# Patient Record
Sex: Female | Born: 1960 | Race: White | Hispanic: No | Marital: Married | Smoking: Never smoker
Health system: Southern US, Community
[De-identification: ages and names within clinical notes are randomized; demographics above are authoritative.]

## PROBLEM LIST (undated history)

## (undated) DIAGNOSIS — M461 Sacroiliitis, not elsewhere classified: Secondary | ICD-10-CM

---

## 1998-01-27 ENCOUNTER — Inpatient Hospital Stay (HOSPITAL_COMMUNITY): Admission: AD | Admit: 1998-01-27 | Discharge: 1998-01-29 | Payer: Self-pay | Admitting: Obstetrics and Gynecology

## 1999-01-03 ENCOUNTER — Other Ambulatory Visit: Admission: RE | Admit: 1999-01-03 | Discharge: 1999-01-03 | Payer: Self-pay | Admitting: Obstetrics and Gynecology

## 2000-01-23 ENCOUNTER — Other Ambulatory Visit: Admission: RE | Admit: 2000-01-23 | Discharge: 2000-01-23 | Payer: Self-pay | Admitting: Obstetrics and Gynecology

## 2001-01-19 ENCOUNTER — Other Ambulatory Visit: Admission: RE | Admit: 2001-01-19 | Discharge: 2001-01-19 | Payer: Self-pay | Admitting: Obstetrics and Gynecology

## 2002-03-09 ENCOUNTER — Other Ambulatory Visit: Admission: RE | Admit: 2002-03-09 | Discharge: 2002-03-09 | Payer: Self-pay | Admitting: Obstetrics and Gynecology

## 2003-08-22 ENCOUNTER — Other Ambulatory Visit: Admission: RE | Admit: 2003-08-22 | Discharge: 2003-08-22 | Payer: Self-pay | Admitting: Obstetrics and Gynecology

## 2004-11-06 ENCOUNTER — Other Ambulatory Visit: Admission: RE | Admit: 2004-11-06 | Discharge: 2004-11-06 | Payer: Self-pay | Admitting: Obstetrics and Gynecology

## 2005-09-19 ENCOUNTER — Encounter: Admission: RE | Admit: 2005-09-19 | Discharge: 2005-09-19 | Payer: Self-pay

## 2006-06-14 ENCOUNTER — Emergency Department (HOSPITAL_COMMUNITY): Admission: EM | Admit: 2006-06-14 | Discharge: 2006-06-14 | Payer: Self-pay | Admitting: Emergency Medicine

## 2006-09-18 ENCOUNTER — Ambulatory Visit (HOSPITAL_COMMUNITY): Admission: RE | Admit: 2006-09-18 | Discharge: 2006-09-18 | Payer: Self-pay | Admitting: Orthopaedic Surgery

## 2006-09-24 ENCOUNTER — Other Ambulatory Visit: Admission: RE | Admit: 2006-09-24 | Discharge: 2006-09-24 | Payer: Self-pay | Admitting: Obstetrics and Gynecology

## 2006-10-31 ENCOUNTER — Encounter: Admission: RE | Admit: 2006-10-31 | Discharge: 2006-10-31 | Payer: Self-pay

## 2009-04-13 ENCOUNTER — Emergency Department (HOSPITAL_COMMUNITY): Admission: EM | Admit: 2009-04-13 | Discharge: 2009-04-13 | Payer: Self-pay | Admitting: Emergency Medicine

## 2014-08-11 ENCOUNTER — Inpatient Hospital Stay (HOSPITAL_COMMUNITY)
Admission: EM | Admit: 2014-08-11 | Discharge: 2014-08-16 | DRG: 419 | Disposition: A | Discharge status: Home / Self Care | Payer: 59 | Attending: General Surgery | Admitting: General Surgery

## 2014-08-11 ENCOUNTER — Encounter (HOSPITAL_COMMUNITY): Payer: Self-pay | Admitting: Emergency Medicine

## 2014-08-11 ENCOUNTER — Emergency Department (HOSPITAL_COMMUNITY): Payer: 59

## 2014-08-11 DIAGNOSIS — K8000 Calculus of gallbladder with acute cholecystitis without obstruction: Principal | ICD-10-CM | POA: Diagnosis present

## 2014-08-11 DIAGNOSIS — Z791 Long term (current) use of non-steroidal anti-inflammatories (NSAID): Secondary | ICD-10-CM

## 2014-08-11 DIAGNOSIS — K8010 Calculus of gallbladder with chronic cholecystitis without obstruction: Secondary | ICD-10-CM | POA: Diagnosis present

## 2014-08-11 DIAGNOSIS — R1011 Right upper quadrant pain: Secondary | ICD-10-CM | POA: Diagnosis not present

## 2014-08-11 DIAGNOSIS — Z886 Allergy status to analgesic agent status: Secondary | ICD-10-CM

## 2014-08-11 DIAGNOSIS — Z882 Allergy status to sulfonamides status: Secondary | ICD-10-CM

## 2014-08-11 HISTORY — DX: Sacroiliitis, not elsewhere classified: M46.1

## 2014-08-11 LAB — COMPREHENSIVE METABOLIC PANEL
ALT: 33 U/L (ref 0–35)
ANION GAP: 14 (ref 5–15)
AST: 20 U/L (ref 0–37)
Albumin: 3.6 g/dL (ref 3.5–5.2)
Alkaline Phosphatase: 237 U/L — ABNORMAL HIGH (ref 39–117)
BUN: 7 mg/dL (ref 6–23)
CALCIUM: 10.3 mg/dL (ref 8.4–10.5)
CO2: 29 mEq/L (ref 19–32)
Chloride: 100 mEq/L (ref 96–112)
Creatinine, Ser: 0.82 mg/dL (ref 0.50–1.10)
GFR calc non Af Amer: 80 mL/min — ABNORMAL LOW (ref >90)
GLUCOSE: 119 mg/dL — AB (ref 70–99)
Potassium: 4.6 mEq/L (ref 3.7–5.3)
SODIUM: 143 meq/L (ref 137–147)
TOTAL PROTEIN: 8 g/dL (ref 6.0–8.3)
Total Bilirubin: 0.8 mg/dL (ref 0.3–1.2)

## 2014-08-11 LAB — CBC WITH DIFFERENTIAL/PLATELET
Basophils Absolute: 0 10*3/uL (ref 0.0–0.1)
Basophils Relative: 0 % (ref 0–1)
EOS ABS: 0.1 10*3/uL (ref 0.0–0.7)
EOS PCT: 1 % (ref 0–5)
HCT: 41.2 % (ref 36.0–46.0)
Hemoglobin: 13.3 g/dL (ref 12.0–15.0)
LYMPHS ABS: 2.6 10*3/uL (ref 0.7–4.0)
Lymphocytes Relative: 20 % (ref 12–46)
MCH: 27.5 pg (ref 26.0–34.0)
MCHC: 32.3 g/dL (ref 30.0–36.0)
MCV: 85.3 fL (ref 78.0–100.0)
Monocytes Absolute: 0.8 10*3/uL (ref 0.1–1.0)
Monocytes Relative: 6 % (ref 3–12)
Neutro Abs: 9.5 10*3/uL — ABNORMAL HIGH (ref 1.7–7.7)
Neutrophils Relative %: 73 % (ref 43–77)
PLATELETS: 300 10*3/uL (ref 150–400)
RBC: 4.83 MIL/uL (ref 3.87–5.11)
RDW: 12.4 % (ref 11.5–15.5)
WBC: 13 10*3/uL — ABNORMAL HIGH (ref 4.0–10.5)

## 2014-08-11 LAB — URINALYSIS, ROUTINE W REFLEX MICROSCOPIC
Glucose, UA: NEGATIVE mg/dL
Hgb urine dipstick: NEGATIVE
Ketones, ur: 15 mg/dL — AB
Nitrite: NEGATIVE
PROTEIN: NEGATIVE mg/dL
Specific Gravity, Urine: 1.018 (ref 1.005–1.030)
UROBILINOGEN UA: 0.2 mg/dL (ref 0.0–1.0)
pH: 6 (ref 5.0–8.0)

## 2014-08-11 LAB — LIPASE, BLOOD: LIPASE: 12 U/L (ref 11–59)

## 2014-08-11 LAB — URINE MICROSCOPIC-ADD ON

## 2014-08-11 MED ORDER — MORPHINE SULFATE 4 MG/ML IJ SOLN
4.0000 mg | Freq: Once | INTRAMUSCULAR | Status: AC
  Administered 2014-08-11: 4 mg via INTRAVENOUS
  Filled 2014-08-11: qty 1

## 2014-08-11 MED ORDER — METOCLOPRAMIDE HCL 5 MG/ML IJ SOLN
10.0000 mg | INTRAMUSCULAR | Status: AC
  Administered 2014-08-11: 10 mg via INTRAVENOUS
  Filled 2014-08-11: qty 2

## 2014-08-11 MED ORDER — HYDROMORPHONE HCL PF 1 MG/ML IJ SOLN
1.0000 mg | Freq: Once | INTRAMUSCULAR | Status: AC
  Administered 2014-08-11: 1 mg via INTRAVENOUS
  Filled 2014-08-11: qty 1

## 2014-08-11 MED ORDER — SODIUM CHLORIDE 0.9 % IV BOLUS (SEPSIS)
1000.0000 mL | Freq: Once | INTRAVENOUS | Status: AC
  Administered 2014-08-11: 1000 mL via INTRAVENOUS

## 2014-08-11 MED ORDER — ONDANSETRON 4 MG PO TBDP
8.0000 mg | ORAL_TABLET | Freq: Once | ORAL | Status: AC
  Administered 2014-08-11: 8 mg via ORAL
  Filled 2014-08-11: qty 2

## 2014-08-11 NOTE — ED Provider Notes (Signed)
CSN: [id number]     Arrival date & time 08/11/14  1708 History   First MD Initiated Contact with Patient 08/11/14 2011     Chief Complaint  Patient presents with  . Cholelithiasis    (Consider location/radiation/quality/duration/timing/severity/associated sxs/prior Treatment) HPI Comments: Patient is a 53 year old female with no significant past medical history who presents to the emergency department for right upper abdominal pain. Patient states that she has had the pain for 4-6 weeks. She states the pain is sharp in nature and radiates into her chest at times. Patient denies any alleviating factors of her symptoms, but she states that eating makes her pain worse. She states that every time she eats she vomits and when she is not experiencing emesis she is nauseous. Patient also states she has been having brown, watery diarrhea fairly consistently since symptom onset. Patient denies fever, chest pain, shortness of breath, melena or hematochezia, hematemesis, dysuria or hematuria, vaginal complaints, and syncope. Patient denies a history of abdominal surgeries. She states that she has had a 25 pound weight loss unintentionally in the last 3 months; she thinks this is likely secondary to decreased oral intake in an effort to lessen her vomiting.  The history is provided by the patient. No language interpreter was used.    Past Medical History  Diagnosis Date  . SI (sacroiliac) joint inflammation    History reviewed. No pertinent past surgical history. No family history on file. History  Substance Use Topics  . Smoking status: Never Smoker   . Smokeless tobacco: Not on file  . Alcohol Use: No   OB History   Grav Para Term Preterm Abortions TAB SAB Ect Mult Living                  Review of Systems  Constitutional: Negative for fever.  Respiratory: Negative for shortness of breath.   Cardiovascular: Negative for chest pain.  Gastrointestinal: Positive for nausea, vomiting, abdominal  pain and diarrhea.  Genitourinary: Negative for dysuria, hematuria, vaginal bleeding and vaginal discharge.  All other systems reviewed and are negative.    Allergies  Sulfa antibiotics; Adhesive; and Toradol  Home Medications   Prior to Admission medications   Medication Sig Start Date End Date Taking? Authorizing Provider  clonazePAM (KLONOPIN) 0.5 MG tablet Take 0.5 mg by mouth at bedtime.   Yes Historical Provider, MD  HYDROcodone-acetaminophen (NORCO) 7.5-325 MG per tablet Take 1 tablet by mouth every 8 (eight) hours.   Yes Historical Provider, MD   BP 118/59  Pulse 73  Temp(Src) 98.2 F (36.8 C) (Oral)  Resp 17  Ht 5\' 7"  (1.702 m)  Wt 162 lb (73.483 kg)  BMI 25.37 kg/m2  SpO2 93%  Physical Exam  Nursing note and vitals reviewed. Constitutional: She is oriented to person, place, and time. She appears well-developed and well-nourished. No distress.  Nontoxic/nonseptic appearing  HENT:  Head: Normocephalic and atraumatic.  Mouth/Throat: Oropharynx is clear and moist. No oropharyngeal exudate.  Eyes: Conjunctivae and EOM are normal. No scleral icterus.  Neck: Normal range of motion.  Cardiovascular: Normal rate, regular rhythm and intact distal pulses.   Murmur (III/VI diastolic murmur) heard. Pulmonary/Chest: Effort normal and breath sounds normal. No respiratory distress. She has no wheezes. She has no rales.  Chest expansion symmetric. No tachypnea or dyspnea.  Abdominal: Soft. She exhibits no distension. There is tenderness. There is guarding (Voluntary). There is no rebound.  Patient has a soft abdomen with tenderness to palpation in the right upper  abdomen and right mid abdomen. Positive Murphy's sign. No masses or peritoneal signs appreciated. No involuntary guarding.  Musculoskeletal: Normal range of motion.  Neurological: She is alert and oriented to person, place, and time. She exhibits normal muscle tone. Coordination normal.  Skin: Skin is warm and dry. No  rash noted. She is not diaphoretic. No erythema. No pallor.  Psychiatric: She has a normal mood and affect. Her behavior is normal.    ED Course  Procedures (including critical care time) Labs Review Labs Reviewed  CBC WITH DIFFERENTIAL - Abnormal; Notable for the following:    WBC 13.0 (*)    Neutro Abs 9.5 (*)    All other components within normal limits  COMPREHENSIVE METABOLIC PANEL - Abnormal; Notable for the following:    Glucose, Bld 119 (*)    Alkaline Phosphatase 237 (*)    GFR calc non Af Amer 80 (*)    All other components within normal limits  URINALYSIS, ROUTINE W REFLEX MICROSCOPIC - Abnormal; Notable for the following:    Color, Urine AMBER (*)    Bilirubin Urine SMALL (*)    Ketones, ur 15 (*)    Leukocytes, UA TRACE (*)    All other components within normal limits  LIPASE, BLOOD  URINE MICROSCOPIC-ADD ON    Imaging Review US Abdomen Complete  08/12/2014   CLINICAL DATA:  Abdominal pain.  EXAM: ULTRASOUND ABDOMEN COMPLETE  COMPARISON:  None.  FINDINGS: Gallbladder:  Multiple echogenic gallstones with acoustic shadowing measuring up to 15 mm. Gallbladder wall thickening to 4.1 mm. Sonographic Murphy's sign elicited. No pericholecystic fluid.  Common bile duct:  Diameter: 6 mm  Liver:  Diffusely mildly hypoechoic with apparent starry sky appearance. No intrahepatic biliary dilatation.  IVC:  No abnormality visualized.  Pancreas:  Visualized portion unremarkable.  Spleen:  Size and appearance within normal limits.  Right Kidney:  Length: 11 cm.  Mildly echogenic without mass or hydronephrosis.  Left Kidney:  Length: 10.5 cm.  Mildly echogenic without mass or hydronephrosis.  Abdominal aorta:  No aneurysm visualized.  Other findings:  None.  IMPRESSION: Cholelithiasis and suspected acute cholecystitis.  Starry sky appearance of liver could reflect acute hepatitis, right heart failure, consider dedicated chest radiograph an correlation with liver function tests.    Electronically Signed   By: Awilda Metro   On: 08/12/2014 00:10     EKG Interpretation None      MDM   Final diagnoses:  Right upper quadrant abdominal pain  Calculus of gallbladder with acute cholecystitis without obstruction    53 year old female presents to the emergency department for right upper abdominal pain x4-6 weeks. Symptoms associated with nausea associated with eating or drinking as well as looser brown stool. Patient with tenderness to palpation in her right upper abdomen with positive Murphy sign. No peritoneal signs. She was found to have a leukocytosis of 13 today as well as an elevated alkaline phosphatase of 237. Other LFTs and total bilirubin normal. Lipase normal.  Ultrasound today shows cholelithiasis and suspected acute cholecystitis. Ultrasound also notes a starry sky appearance of the liver; however, patient with no definitive signs of heart failure. No crackles, tachypnea, dyspnea, or hypoxia. I consulted with general surgery, Dr. Janee Morn, who will see the patient and evaluate for admission and, likely, laparoscopic cholecystectomy.   Filed Vitals:   08/11/14 2230 08/11/14 2300 08/12/14 0002 08/12/14 0030  BP: 139/71 117/57 115/51 118/59  Pulse: 77 76 76 73  Temp:      TempSrc:  Resp: 28 22 19 17   Height:      Weight:      SpO2: 94% 90% 93% 93%       Antony MaduraKelly Elliott Quade, PA-C 08/12/14 [phone]

## 2014-08-11 NOTE — ED Notes (Signed)
Pt reports 4-6 weeks of vomiting with eating and RUQ pain. Went to PCP recently, was told likely gallstones but was unable to make it to ultrasound appointment. Pt is nauseated at current. Pt is a x 4.

## 2014-08-11 NOTE — ED Notes (Signed)
Asked pt to provide urine specimen pt stated she could not provide one at this time.  

## 2014-08-12 ENCOUNTER — Observation Stay (HOSPITAL_COMMUNITY): Payer: 59 | Admitting: Anesthesiology

## 2014-08-12 ENCOUNTER — Emergency Department (HOSPITAL_COMMUNITY): Payer: 59

## 2014-08-12 ENCOUNTER — Encounter (HOSPITAL_COMMUNITY): Payer: 59 | Admitting: Anesthesiology

## 2014-08-12 ENCOUNTER — Encounter (HOSPITAL_COMMUNITY): Admission: EM | Disposition: A | Discharge status: Home / Self Care | Payer: Self-pay | Source: Home / Self Care

## 2014-08-12 DIAGNOSIS — R1011 Right upper quadrant pain: Secondary | ICD-10-CM

## 2014-08-12 DIAGNOSIS — K8020 Calculus of gallbladder without cholecystitis without obstruction: Secondary | ICD-10-CM

## 2014-08-12 DIAGNOSIS — K8010 Calculus of gallbladder with chronic cholecystitis without obstruction: Secondary | ICD-10-CM | POA: Diagnosis present

## 2014-08-12 HISTORY — PX: CHOLECYSTECTOMY: SHX55

## 2014-08-12 LAB — CBC
HCT: 33.4 % — ABNORMAL LOW (ref 36.0–46.0)
Hemoglobin: 11.1 g/dL — ABNORMAL LOW (ref 12.0–15.0)
MCH: 28.8 pg (ref 26.0–34.0)
MCHC: 33.2 g/dL (ref 30.0–36.0)
MCV: 86.5 fL (ref 78.0–100.0)
PLATELETS: 231 10*3/uL (ref 150–400)
RBC: 3.86 MIL/uL — ABNORMAL LOW (ref 3.87–5.11)
RDW: 12.4 % (ref 11.5–15.5)
WBC: 8.9 10*3/uL (ref 4.0–10.5)

## 2014-08-12 LAB — COMPREHENSIVE METABOLIC PANEL
ALK PHOS: 229 U/L — AB (ref 39–117)
ALT: 33 U/L (ref 0–35)
ANION GAP: 11 (ref 5–15)
AST: 33 U/L (ref 0–37)
Albumin: 2.5 g/dL — ABNORMAL LOW (ref 3.5–5.2)
BILIRUBIN TOTAL: 0.7 mg/dL (ref 0.3–1.2)
BUN: 6 mg/dL (ref 6–23)
CO2: 26 mEq/L (ref 19–32)
Calcium: 8.8 mg/dL (ref 8.4–10.5)
Chloride: 106 mEq/L (ref 96–112)
Creatinine, Ser: 0.88 mg/dL (ref 0.50–1.10)
GFR, EST AFRICAN AMERICAN: 85 mL/min — AB (ref >90)
GFR, EST NON AFRICAN AMERICAN: 73 mL/min — AB (ref >90)
GLUCOSE: 139 mg/dL — AB (ref 70–99)
Potassium: 4 mEq/L (ref 3.7–5.3)
Sodium: 143 mEq/L (ref 137–147)
Total Protein: 6.2 g/dL (ref 6.0–8.3)

## 2014-08-12 LAB — SURGICAL PCR SCREEN
MRSA, PCR: NEGATIVE
STAPHYLOCOCCUS AUREUS: NEGATIVE

## 2014-08-12 SURGERY — LAPAROSCOPIC CHOLECYSTECTOMY WITH INTRAOPERATIVE CHOLANGIOGRAM
Anesthesia: General | Site: Abdomen

## 2014-08-12 MED ORDER — DIPHENHYDRAMINE HCL 50 MG/ML IJ SOLN
INTRAMUSCULAR | Status: AC
  Filled 2014-08-12: qty 1

## 2014-08-12 MED ORDER — NEOSTIGMINE METHYLSULFATE 10 MG/10ML IV SOLN
INTRAVENOUS | Status: DC | PRN
  Administered 2014-08-12: 3 mg via INTRAVENOUS

## 2014-08-12 MED ORDER — OXYCODONE HCL 5 MG/5ML PO SOLN: 5.0000 mg | Freq: Once | ORAL | Status: DC | PRN

## 2014-08-12 MED ORDER — ACETAMINOPHEN 325 MG PO TABS: 650.0000 mg | ORAL_TABLET | Freq: Four times a day (QID) | ORAL | Status: DC | PRN

## 2014-08-12 MED ORDER — CIPROFLOXACIN IN D5W 400 MG/200ML IV SOLN
400.0000 mg | Freq: Two times a day (BID) | INTRAVENOUS | Status: DC
  Administered 2014-08-12 – 2014-08-13 (×4): 400 mg via INTRAVENOUS
  Filled 2014-08-12 (×6): qty 200

## 2014-08-12 MED ORDER — DEXAMETHASONE SODIUM PHOSPHATE 4 MG/ML IJ SOLN
INTRAMUSCULAR | Status: AC
  Filled 2014-08-12: qty 1

## 2014-08-12 MED ORDER — PANTOPRAZOLE SODIUM 40 MG IV SOLR
40.0000 mg | Freq: Every day | INTRAVENOUS | Status: DC
  Administered 2014-08-12 – 2014-08-13 (×2): 40 mg via INTRAVENOUS
  Filled 2014-08-12 (×5): qty 40

## 2014-08-12 MED ORDER — DIPHENHYDRAMINE HCL 12.5 MG/5ML PO ELIX: 12.5000 mg | ORAL_SOLUTION | Freq: Four times a day (QID) | ORAL | Status: DC | PRN

## 2014-08-12 MED ORDER — ROCURONIUM BROMIDE 100 MG/10ML IV SOLN
INTRAVENOUS | Status: DC | PRN
  Administered 2014-08-12: 35 mg via INTRAVENOUS
  Administered 2014-08-12: 5 mg via INTRAVENOUS
  Administered 2014-08-12: 10 mg via INTRAVENOUS

## 2014-08-12 MED ORDER — OXYCODONE HCL 5 MG PO TABS: 5.0000 mg | ORAL_TABLET | Freq: Once | ORAL | Status: DC | PRN

## 2014-08-12 MED ORDER — HYDROMORPHONE HCL PF 1 MG/ML IJ SOLN: 0.2500 mg | INTRAMUSCULAR | Status: DC | PRN

## 2014-08-12 MED ORDER — 0.9 % SODIUM CHLORIDE (POUR BTL) OPTIME
TOPICAL | Status: DC | PRN
  Administered 2014-08-12: 1000 mL

## 2014-08-12 MED ORDER — GLYCOPYRROLATE 0.2 MG/ML IJ SOLN
INTRAMUSCULAR | Status: DC | PRN
  Administered 2014-08-12: 0.4 mg via INTRAVENOUS

## 2014-08-12 MED ORDER — PHENYLEPHRINE HCL 10 MG/ML IJ SOLN
INTRAMUSCULAR | Status: DC | PRN
  Administered 2014-08-12: 120 ug via INTRAVENOUS

## 2014-08-12 MED ORDER — BSS IO SOLN
INTRAOCULAR | Status: AC
  Filled 2014-08-12: qty 15

## 2014-08-12 MED ORDER — DIPHENHYDRAMINE HCL 50 MG/ML IJ SOLN
12.5000 mg | Freq: Four times a day (QID) | INTRAMUSCULAR | Status: DC | PRN
  Administered 2014-08-12: 12.5 mg via INTRAVENOUS

## 2014-08-12 MED ORDER — PROMETHAZINE HCL 25 MG/ML IJ SOLN: 6.2500 mg | INTRAMUSCULAR | Status: DC | PRN

## 2014-08-12 MED ORDER — ONDANSETRON HCL 4 MG/2ML IJ SOLN
INTRAMUSCULAR | Status: DC | PRN
  Administered 2014-08-12: 4 mg via INTRAVENOUS

## 2014-08-12 MED ORDER — BUPIVACAINE-EPINEPHRINE 0.25% -1:200000 IJ SOLN
INTRAMUSCULAR | Status: DC | PRN
  Administered 2014-08-12: 30 mL

## 2014-08-12 MED ORDER — FENTANYL CITRATE 0.05 MG/ML IJ SOLN
INTRAMUSCULAR | Status: AC
  Filled 2014-08-12: qty 5

## 2014-08-12 MED ORDER — MORPHINE SULFATE 2 MG/ML IJ SOLN
2.0000 mg | INTRAMUSCULAR | Status: DC | PRN
  Administered 2014-08-12 – 2014-08-15 (×23): 4 mg via INTRAVENOUS
  Filled 2014-08-12: qty 2
  Filled 2014-08-12: qty 1
  Filled 2014-08-12 (×4): qty 2
  Filled 2014-08-12: qty 1
  Filled 2014-08-12 (×17): qty 2

## 2014-08-12 MED ORDER — ACETAMINOPHEN 650 MG RE SUPP: 650.0000 mg | Freq: Four times a day (QID) | RECTAL | Status: DC | PRN

## 2014-08-12 MED ORDER — PIPERACILLIN-TAZOBACTAM 3.375 G IVPB
3.3750 g | Freq: Once | INTRAVENOUS | Status: AC
  Administered 2014-08-12: 3.375 g via INTRAVENOUS
  Filled 2014-08-12: qty 50

## 2014-08-12 MED ORDER — LIDOCAINE HCL (CARDIAC) 20 MG/ML IV SOLN
INTRAVENOUS | Status: DC | PRN
  Administered 2014-08-12: 100 mg via INTRAVENOUS

## 2014-08-12 MED ORDER — LIDOCAINE HCL (CARDIAC) 20 MG/ML IV SOLN
INTRAVENOUS | Status: AC
  Filled 2014-08-12: qty 5

## 2014-08-12 MED ORDER — ROCURONIUM BROMIDE 50 MG/5ML IV SOLN
INTRAVENOUS | Status: AC
  Filled 2014-08-12: qty 1

## 2014-08-12 MED ORDER — MIDAZOLAM HCL 5 MG/5ML IJ SOLN
INTRAMUSCULAR | Status: DC | PRN
  Administered 2014-08-12: 2 mg via INTRAVENOUS

## 2014-08-12 MED ORDER — PROPOFOL 10 MG/ML IV BOLUS
INTRAVENOUS | Status: DC | PRN
  Administered 2014-08-12: 150 mg via INTRAVENOUS

## 2014-08-12 MED ORDER — ONDANSETRON HCL 4 MG/2ML IJ SOLN
INTRAMUSCULAR | Status: AC
  Filled 2014-08-12: qty 2

## 2014-08-12 MED ORDER — PROPOFOL 10 MG/ML IV BOLUS
INTRAVENOUS | Status: AC
  Filled 2014-08-12: qty 20

## 2014-08-12 MED ORDER — ONDANSETRON HCL 4 MG/2ML IJ SOLN
4.0000 mg | Freq: Four times a day (QID) | INTRAMUSCULAR | Status: DC | PRN
  Administered 2014-08-13 – 2014-08-15 (×5): 4 mg via INTRAVENOUS
  Filled 2014-08-12 (×5): qty 2

## 2014-08-12 MED ORDER — DEXTROSE-NACL 5-0.45 % IV SOLN
INTRAVENOUS | Status: DC
  Administered 2014-08-12 – 2014-08-14 (×5): via INTRAVENOUS

## 2014-08-12 MED ORDER — SODIUM CHLORIDE 0.9 % IR SOLN
Status: DC | PRN
  Administered 2014-08-12: 1000 mL

## 2014-08-12 MED ORDER — ONDANSETRON HCL 4 MG/2ML IJ SOLN
4.0000 mg | INTRAMUSCULAR | Status: AC
  Administered 2014-08-12: 4 mg via INTRAVENOUS
  Filled 2014-08-12: qty 2

## 2014-08-12 MED ORDER — LACTATED RINGERS IV SOLN
INTRAVENOUS | Status: DC | PRN
  Administered 2014-08-12 (×2): via INTRAVENOUS

## 2014-08-12 MED ORDER — IOHEXOL 300 MG/ML  SOLN: 25.0000 mL | Freq: Once | INTRAMUSCULAR | Status: DC | PRN

## 2014-08-12 MED ORDER — ENOXAPARIN SODIUM 40 MG/0.4ML ~~LOC~~ SOLN
40.0000 mg | SUBCUTANEOUS | Status: DC
  Administered 2014-08-13 – 2014-08-16 (×4): 40 mg via SUBCUTANEOUS
  Filled 2014-08-12 (×5): qty 0.4

## 2014-08-12 MED ORDER — HEMOSTATIC AGENTS (NO CHARGE) OPTIME
TOPICAL | Status: DC | PRN
  Administered 2014-08-12: 1 via TOPICAL

## 2014-08-12 MED ORDER — CLONAZEPAM 0.5 MG PO TABS
0.5000 mg | ORAL_TABLET | Freq: Every day | ORAL | Status: DC
  Administered 2014-08-12 – 2014-08-15 (×5): 0.5 mg via ORAL
  Filled 2014-08-12 (×5): qty 1

## 2014-08-12 MED ORDER — DEXAMETHASONE SODIUM PHOSPHATE 10 MG/ML IJ SOLN
INTRAMUSCULAR | Status: DC | PRN
  Administered 2014-08-12: 4 mg via INTRAVENOUS

## 2014-08-12 MED ORDER — MIDAZOLAM HCL 2 MG/2ML IJ SOLN
INTRAMUSCULAR | Status: AC
  Filled 2014-08-12: qty 2

## 2014-08-12 MED ORDER — FENTANYL CITRATE 0.05 MG/ML IJ SOLN
INTRAMUSCULAR | Status: DC | PRN
  Administered 2014-08-12: 50 ug via INTRAVENOUS
  Administered 2014-08-12: 100 ug via INTRAVENOUS
  Administered 2014-08-12: 50 ug via INTRAVENOUS
  Administered 2014-08-12: 100 ug via INTRAVENOUS
  Administered 2014-08-12: 50 ug via INTRAVENOUS

## 2014-08-12 MED ORDER — BUPIVACAINE-EPINEPHRINE (PF) 0.25% -1:200000 IJ SOLN
INTRAMUSCULAR | Status: AC
Start: 2014-08-12 — End: 2014-08-12
  Filled 2014-08-12: qty 30

## 2014-08-12 SURGICAL SUPPLY — 59 items
APL SKNCLS STERI-STRIP NONHPOA (GAUZE/BANDAGES/DRESSINGS) ×1
APPLIER CLIP ROT 10 11.4 M/L (STAPLE) ×3
APR CLP MED LRG 11.4X10 (STAPLE) ×1
BAG SPEC RTRVL LRG 6X4 10 (ENDOMECHANICALS) ×1
BENZOIN TINCTURE PRP APPL 2/3 (GAUZE/BANDAGES/DRESSINGS) ×3 IMPLANT
BLADE SURG ROTATE 9660 (MISCELLANEOUS) IMPLANT
CANISTER SUCTION 2500CC (MISCELLANEOUS) ×3 IMPLANT
CHLORAPREP W/TINT 26ML (MISCELLANEOUS) ×3 IMPLANT
CLIP APPLIE ROT 10 11.4 M/L (STAPLE) ×1 IMPLANT
CLOSURE WOUND 1/2 X4 (GAUZE/BANDAGES/DRESSINGS) ×1
COVER MAYO STAND STRL (DRAPES) ×3 IMPLANT
COVER SURGICAL LIGHT HANDLE (MISCELLANEOUS) ×3 IMPLANT
DRAIN CHANNEL 19F RND (DRAIN) ×2 IMPLANT
DRAPE C-ARM 42X72 X-RAY (DRAPES) ×3 IMPLANT
DRAPE LAPAROSCOPIC ABDOMINAL (DRAPES) ×2 IMPLANT
DRAPE UTILITY 15X26 W/TAPE STR (DRAPE) ×6 IMPLANT
DRSG TEGADERM 2-3/8X2-3/4 SM (GAUZE/BANDAGES/DRESSINGS) ×5 IMPLANT
DRSG TEGADERM 4X4.75 (GAUZE/BANDAGES/DRESSINGS) ×1 IMPLANT
ELECT REM PT RETURN 9FT ADLT (ELECTROSURGICAL) ×3
ELECTRODE REM PT RTRN 9FT ADLT (ELECTROSURGICAL) ×1 IMPLANT
EVACUATOR SILICONE 100CC (DRAIN) ×2 IMPLANT
FILTER SMOKE EVAC LAPAROSHD (FILTER) ×3 IMPLANT
GAUZE SPONGE 2X2 8PLY STRL LF (GAUZE/BANDAGES/DRESSINGS) ×1 IMPLANT
GLOVE BIO SURGEON STRL SZ7 (GLOVE) ×7 IMPLANT
GLOVE BIO SURGEON STRL SZ7.5 (GLOVE) ×4 IMPLANT
GLOVE BIOGEL PI IND STRL 7.0 (GLOVE) IMPLANT
GLOVE BIOGEL PI IND STRL 7.5 (GLOVE) ×1 IMPLANT
GLOVE BIOGEL PI IND STRL 8 (GLOVE) IMPLANT
GLOVE BIOGEL PI INDICATOR 7.0 (GLOVE) ×4
GLOVE BIOGEL PI INDICATOR 7.5 (GLOVE) ×4
GLOVE BIOGEL PI INDICATOR 8 (GLOVE) ×2
GOWN STRL REUS W/ TWL LRG LVL3 (GOWN DISPOSABLE) ×4 IMPLANT
GOWN STRL REUS W/ TWL XL LVL3 (GOWN DISPOSABLE) IMPLANT
GOWN STRL REUS W/TWL LRG LVL3 (GOWN DISPOSABLE) ×12
GOWN STRL REUS W/TWL XL LVL3 (GOWN DISPOSABLE) ×3
HEMOSTAT SNOW SURGICEL 2X4 (HEMOSTASIS) ×2 IMPLANT
KIT BASIN OR (CUSTOM PROCEDURE TRAY) ×3 IMPLANT
KIT ROOM TURNOVER OR (KITS) ×3 IMPLANT
NS IRRIG 1000ML POUR BTL (IV SOLUTION) ×3 IMPLANT
PAD ARMBOARD 7.5X6 YLW CONV (MISCELLANEOUS) ×3 IMPLANT
POUCH SPECIMEN RETRIEVAL 10MM (ENDOMECHANICALS) ×3 IMPLANT
SCISSORS LAP 5X35 DISP (ENDOMECHANICALS) ×3 IMPLANT
SET CHOLANGIOGRAPH 5 50 .035 (SET/KITS/TRAYS/PACK) ×1 IMPLANT
SET IRRIG TUBING LAPAROSCOPIC (IRRIGATION / IRRIGATOR) ×3 IMPLANT
SLEEVE ENDOPATH XCEL 5M (ENDOMECHANICALS) ×3 IMPLANT
SPECIMEN JAR SMALL (MISCELLANEOUS) ×3 IMPLANT
SPONGE GAUZE 2X2 STER 10/PKG (GAUZE/BANDAGES/DRESSINGS) ×2
SPONGE GAUZE 4X4 12PLY STER LF (GAUZE/BANDAGES/DRESSINGS) ×2 IMPLANT
STRIP CLOSURE SKIN 1/2X4 (GAUZE/BANDAGES/DRESSINGS) ×1 IMPLANT
SUT ETHILON 2 0 FS 18 (SUTURE) ×2 IMPLANT
SUT MNCRL AB 4-0 PS2 18 (SUTURE) ×3 IMPLANT
SUT VICRYL 0 UR6 27IN ABS (SUTURE) ×2 IMPLANT
TAPE CLOTH SOFT 2X10 (GAUZE/BANDAGES/DRESSINGS) ×2 IMPLANT
TOWEL OR 17X24 6PK STRL BLUE (TOWEL DISPOSABLE) ×3 IMPLANT
TOWEL OR 17X26 10 PK STRL BLUE (TOWEL DISPOSABLE) ×3 IMPLANT
TRAY LAPAROSCOPIC (CUSTOM PROCEDURE TRAY) ×3 IMPLANT
TROCAR XCEL BLUNT TIP 100MML (ENDOMECHANICALS) ×3 IMPLANT
TROCAR XCEL NON-BLD 11X100MML (ENDOMECHANICALS) ×3 IMPLANT
TROCAR XCEL NON-BLD 5MMX100MML (ENDOMECHANICALS) ×3 IMPLANT

## 2014-08-12 NOTE — Transfer of Care (Signed)
Immediate Anesthesia Transfer of Care Note  Patient: Meghan Smith  Procedure(s) Performed: Procedure(s): LAPAROSCOPIC CHOLECYSTECTOMY (N/A)  Patient Location: PACU  Anesthesia Type:General  Level of Consciousness: awake, oriented and patient cooperative  Airway & Oxygen Therapy: Patient Spontanous Breathing and Patient connected to nasal cannula oxygen  Post-op Assessment: Report given to PACU RN, Post -op Vital signs reviewed and stable and Patient moving all extremities  Post vital signs: Reviewed and stable  Complications: No apparent anesthesia complications

## 2014-08-12 NOTE — Anesthesia Procedure Notes (Signed)
Procedure Name: Intubation Date/Time: 08/12/2014 11:10 AM Performed by: Coralee RudFLORES, Kali Deadwyler Pre-anesthesia Checklist: Patient identified, Emergency Drugs available, Suction available and Patient being monitored Patient Re-evaluated:Patient Re-evaluated prior to inductionOxygen Delivery Method: Circle system utilized Preoxygenation: Pre-oxygenation with 100% oxygen Intubation Type: IV induction Ventilation: Mask ventilation without difficulty Laryngoscope Size: Miller and 3 Grade View: Grade I Tube type: Oral Tube size: 7.5 mm Number of attempts: 1 Airway Equipment and Method: Stylet and Bite block Placement Confirmation: ETT inserted through vocal cords under direct vision,  breath sounds checked- equal and bilateral and positive ETCO2 Secured at: 22 cm Tube secured with: Tape Dental Injury: Teeth and Oropharynx as per pre-operative assessment

## 2014-08-12 NOTE — Anesthesia Preprocedure Evaluation (Addendum)
Anesthesia Evaluation  Patient identified by MRN, date of birth, ID band Patient awake    Reviewed: Allergy & Precautions, H&P , NPO status , Patient's Chart, lab work & pertinent test results, reviewed documented beta blocker date and time   Airway Mallampati: II TM Distance: >3 FB Neck ROM: Full    Dental  (+) Teeth Intact, Dental Advisory Given   Pulmonary neg pulmonary ROS,    Pulmonary exam normal       Cardiovascular negative cardio ROS      Neuro/Psych negative neurological ROS  negative psych ROS   GI/Hepatic negative GI ROS, Neg liver ROS,   Endo/Other  negative endocrine ROS  Renal/GU negative Renal ROS  negative genitourinary   Musculoskeletal negative musculoskeletal ROS (+)   Abdominal   Peds negative pediatric ROS (+)  Hematology negative hematology ROS (+)   Anesthesia Other Findings   Reproductive/Obstetrics negative OB ROS                          Anesthesia Physical Anesthesia Plan  ASA: II  Anesthesia Plan: General   Post-op Pain Management:    Induction: Intravenous  Airway Management Planned: Oral ETT  Additional Equipment:   Intra-op Plan:   Post-operative Plan: Extubation in OR  Informed Consent: I have reviewed the patients History and Physical, chart, labs and discussed the procedure including the risks, benefits and alternatives for the proposed anesthesia with the patient or authorized representative who has indicated his/her understanding and acceptance.   Dental advisory given  Plan Discussed with: CRNA, Anesthesiologist and Surgeon  Anesthesia Plan Comments:        Anesthesia Quick Evaluation

## 2014-08-12 NOTE — H&P (Signed)
Meghan Smith is an 53 y.o. female.   Chief Complaint: RUQ abdominal pain, N/V HPI: Patient has been having episodic right upper quadrant abdominal pain associated with eating for the past 2 months. This became much worse 48 hours ago. She came to the emergency department for evaluation. She was found to have mild leukocytosis of 13,000. Ultrasound was done showing gallstones and suspected acute cholecystitis. Her pain is improved partially after receiving pain medication. She remains nauseated. I was asked to see her for admission.  Past Medical History  Diagnosis Date  . SI (sacroiliac) joint inflammation     History reviewed. No pertinent past surgical history.  No family history on file. Social History:  reports that she has never smoked. She does not have any smokeless tobacco history on file. She reports that she does not drink alcohol or use illicit drugs.  Allergies:  Allergies  Allergen Reactions  . Sulfa Antibiotics Shortness Of Breath, Itching, Swelling and Rash  . Adhesive [Tape] Other (See Comments)    Takes skin off with tape  . Toradol [Ketorolac Tromethamine] Swelling and Other (See Comments)    Face and lip swelling     (Not in a hospital admission)  Results for orders placed during the hospital encounter of 08/11/14 (from the past 48 hour(s))  CBC WITH DIFFERENTIAL     Status: Abnormal   Collection Time    08/11/14  5:28 PM      Result Value Ref Range   WBC 13.0 (*) 4.0 - 10.5 K/uL   RBC 4.83  3.87 - 5.11 MIL/uL   Hemoglobin 13.3  12.0 - 15.0 g/dL   HCT 41.2  36.0 - 46.0 %   MCV 85.3  78.0 - 100.0 fL   MCH 27.5  26.0 - 34.0 pg   MCHC 32.3  30.0 - 36.0 g/dL   RDW 12.4  11.5 - 15.5 %   Platelets 300  150 - 400 K/uL   Neutrophils Relative % 73  43 - 77 %   Neutro Abs 9.5 (*) 1.7 - 7.7 K/uL   Lymphocytes Relative 20  12 - 46 %   Lymphs Abs 2.6  0.7 - 4.0 K/uL   Monocytes Relative 6  3 - 12 %   Monocytes Absolute 0.8  0.1 - 1.0 K/uL   Eosinophils Relative 1   0 - 5 %   Eosinophils Absolute 0.1  0.0 - 0.7 K/uL   Basophils Relative 0  0 - 1 %   Basophils Absolute 0.0  0.0 - 0.1 K/uL  COMPREHENSIVE METABOLIC PANEL     Status: Abnormal   Collection Time    08/11/14  5:28 PM      Result Value Ref Range   Sodium 143  137 - 147 mEq/L   Potassium 4.6  3.7 - 5.3 mEq/L   Chloride 100  96 - 112 mEq/L   CO2 29  19 - 32 mEq/L   Glucose, Bld 119 (*) 70 - 99 mg/dL   BUN 7  6 - 23 mg/dL   Creatinine, Ser 0.82  0.50 - 1.10 mg/dL   Calcium 10.3  8.4 - 10.5 mg/dL   Total Protein 8.0  6.0 - 8.3 g/dL   Albumin 3.6  3.5 - 5.2 g/dL   AST 20  0 - 37 U/L   ALT 33  0 - 35 U/L   Alkaline Phosphatase 237 (*) 39 - 117 U/L   Total Bilirubin 0.8  0.3 - 1.2 mg/dL  GFR calc non Af Amer 80 (*) >90 mL/min   GFR calc Af Amer >90  >90 mL/min   Comment: (NOTE)     The eGFR has been calculated using the CKD EPI equation.     This calculation has not been validated in all clinical situations.     eGFR's persistently <90 mL/min signify possible Chronic Kidney     Disease.   Anion gap 14  5 - 15  LIPASE, BLOOD     Status: None   Collection Time    08/11/14  5:28 PM      Result Value Ref Range   Lipase 12  11 - 59 U/L  URINALYSIS, ROUTINE W REFLEX MICROSCOPIC     Status: Abnormal   Collection Time    08/11/14  8:25 PM      Result Value Ref Range   Color, Urine AMBER (*) YELLOW   Comment: BIOCHEMICALS MAY BE AFFECTED BY COLOR   APPearance CLEAR  CLEAR   Specific Gravity, Urine 1.018  1.005 - 1.030   pH 6.0  5.0 - 8.0   Glucose, UA NEGATIVE  NEGATIVE mg/dL   Hgb urine dipstick NEGATIVE  NEGATIVE   Bilirubin Urine SMALL (*) NEGATIVE   Ketones, ur 15 (*) NEGATIVE mg/dL   Protein, ur NEGATIVE  NEGATIVE mg/dL   Urobilinogen, UA 0.2  0.0 - 1.0 mg/dL   Nitrite NEGATIVE  NEGATIVE   Leukocytes, UA TRACE (*) NEGATIVE  URINE MICROSCOPIC-ADD ON     Status: None   Collection Time    08/11/14  8:25 PM      Result Value Ref Range   Squamous Epithelial / LPF RARE  RARE    WBC, UA 0-2  <3 WBC/hpf   Bacteria, UA RARE  RARE   Urine-Other MUCOUS PRESENT     Comment: AMORPHOUS URATES/PHOSPHATES   US Abdomen Complete  08/12/2014   CLINICAL DATA:  Abdominal pain.  EXAM: ULTRASOUND ABDOMEN COMPLETE  COMPARISON:  None.  FINDINGS: Gallbladder:  Multiple echogenic gallstones with acoustic shadowing measuring up to 15 mm. Gallbladder wall thickening to 4.1 mm. Sonographic Murphy's sign elicited. No pericholecystic fluid.  Common bile duct:  Diameter: 6 mm  Liver:  Diffusely mildly hypoechoic with apparent starry sky appearance. No intrahepatic biliary dilatation.  IVC:  No abnormality visualized.  Pancreas:  Visualized portion unremarkable.  Spleen:  Size and appearance within normal limits.  Right Kidney:  Length: 11 cm.  Mildly echogenic without mass or hydronephrosis.  Left Kidney:  Length: 10.5 cm.  Mildly echogenic without mass or hydronephrosis.  Abdominal aorta:  No aneurysm visualized.  Other findings:  None.  IMPRESSION: Cholelithiasis and suspected acute cholecystitis.  Starry sky appearance of liver could reflect acute hepatitis, right heart failure, consider dedicated chest radiograph an correlation with liver function tests.   Electronically Signed   By: Elon Alas   On: 08/12/2014 00:10    Review of Systems  Constitutional: Positive for weight loss and malaise/fatigue.  HENT: Negative.   Eyes: Negative.   Respiratory: Negative.   Cardiovascular: Negative.   Gastrointestinal: Positive for nausea, vomiting and abdominal pain.  Genitourinary: Negative.   Musculoskeletal: Negative.   Skin: Negative.   Neurological: Negative.   Endo/Heme/Allergies: Negative.   Psychiatric/Behavioral: Negative.     Blood pressure 118/59, pulse 73, temperature 98.2 F (36.8 C), temperature source Oral, resp. rate 17, height 5' 7"  (1.702 m), weight 162 lb (73.483 kg), SpO2 93.00%. Physical Exam  Constitutional: She is oriented to person, place,  and time. She appears  well-developed and well-nourished. No distress.  HENT:  Head: Normocephalic and atraumatic.  Mouth/Throat: Oropharynx is clear and moist. No oropharyngeal exudate.  Eyes: EOM are normal. Pupils are equal, round, and reactive to light. No scleral icterus.  Neck: Normal range of motion. Neck supple. No tracheal deviation present. No thyromegaly present.  Cardiovascular: Normal rate and normal heart sounds.   Rare ectopy  Respiratory: Effort normal and breath sounds normal. No stridor. No respiratory distress. She has no wheezes. She has no rales.  GI: Soft. She exhibits no distension. There is tenderness. There is no rebound and no guarding.    Tender right upper quadrant without guarding, no generalized tenderness, scar beneath the umbilicus  Musculoskeletal: Normal range of motion. She exhibits no edema and no tenderness.  Neurological: She is alert and oriented to person, place, and time. She exhibits normal muscle tone.  Skin: Skin is warm.  Psychiatric: She has a normal mood and affect.     Assessment/Plan Cholecystitis with cholelithiasis - will admit, keep n.p.o., give IV fluids and IV antibiotics. Plan will be for laparoscopic cholecystectomy with intraoperative cholangiogram this admission, possibly later today. I will discuss with my partner, Dr. Georgette Dover. Procedure, risks, and benefits were discussed in detail the patient and her husband. She is agreeable.  Donata Reddick E 08/12/2014, 1:07 AM

## 2014-08-12 NOTE — Anesthesia Postprocedure Evaluation (Signed)
Anesthesia Post Note  Patient: Meghan Smith  Procedure(s) Performed: Procedure(s) (LRB): LAPAROSCOPIC CHOLECYSTECTOMY (N/A)  Anesthesia type: general  Patient location: PACU  Post pain: Pain level controlled  Post assessment: Patient's Cardiovascular Status Stable  Last Vitals:  Filed Vitals:   08/12/14 1414  BP:   Pulse: 64  Temp: 36.9 C  Resp: 15    Post vital signs: Reviewed and stable  Level of consciousness: sedated  Complications: No apparent anesthesia complications

## 2014-08-12 NOTE — Op Note (Signed)
Laparoscopic Cholecystectomy with IOC Procedure Note  Indications: This patient presents with symptomatic gallbladder disease and will undergo laparoscopic cholecystectomy.  Pre-operative Diagnosis: Calculus of gallbladder with acute cholecystitis, without mention of obstruction  Post-operative Diagnosis: Calculus of gallbladder with acute cholecystitis, without mention of obstruction  Surgeon: Merced Brougham K.   Assistants: Dr. Axel FillerArmando Ramirez  Anesthesia: General endotracheal anesthesia  ASA Class: 3  Procedure Details  The patient was seen again in the Holding Room. The risks, benefits, complications, treatment options, and expected outcomes were discussed with the patient. The possibilities of reaction to medication, pulmonary aspiration, perforation of viscus, bleeding, recurrent infection, finding a normal gallbladder, the need for additional procedures, failure to diagnose a condition, the possible need to convert to an open procedure, and creating a complication requiring transfusion or operation were discussed with the patient. The likelihood of improving the patient's symptoms with return to their baseline status is good.  The patient and/or family concurred with the proposed plan, giving informed consent. The site of surgery properly noted. The patient was taken to Operating Room, identified as Meghan JulianAlyce S Reichart and the procedure verified as Laparoscopic Cholecystectomy with Intraoperative Cholangiogram. A Time Out was held and the above information confirmed.  Prior to the induction of general anesthesia, antibiotic prophylaxis was administered. General endotracheal anesthesia was then administered and tolerated well. After the induction, the abdomen was prepped with Chloraprep and draped in the sterile fashion. The patient was positioned in the supine position.  Local anesthetic agent was injected into the skin above the umbilicus and an incision made. We dissected down to the abdominal  fascia with blunt dissection.  She has a previous umbilical hernia repair from an infraumbilical incision, and we were hoping to go above the mesh.  The fascia was incised vertically but we encountered the upper edge of the mesh.  I had difficulty visualizing the peritoneum, so I decided to perform an Optiview entry into the LUQ.  A 5 mm Optiview trocar was inserted in the LUQ and used to cannulate the peritoneum.    Pneumoperitoneum was then created with CO2 and tolerated well without any adverse changes in the patient's vital signs.  The scope was inserted and we examined the fascia around the umbilicus.  The mesh is preperitoneal.  Under direct vision, we were able to enter the peritoneum through the umbilical incision. A pursestring suture of 0-Vicryl was placed around the fascial opening.  The Hasson cannula was inserted and secured with the stay suture.  An 11-mm port was placed in the subxiphoid position.  Two 5-mm ports were placed in the right upper quadrant. All skin incisions were infiltrated with a local anesthetic agent before making the incision and placing the trocars.   We positioned the patient in reverse Trendelenburg, tilted slightly to the patient's left.  The omentum is densely adherent to a very large thickened gallbladder.  We began bluntly dissecting the omentum away from the gallbladder wall.  The gallbladder was identified, the fundus grasped and retracted cephalad. Adhesions were lysed bluntly and with the electrocautery where indicated, taking care not to injure any adjacent organs or viscus.  The gallbladder is covered with a very thick inflammatory rind.  We bluntly peeled the rind and the adjacent small bowel away from the gallbladder wall. We continued dissecting down the surface of this dilated gallbladder.  There is a large stone impacted in the neck of the gallbladder.  When we grasped the infundibulum to expose the gallbladder, it appeared that the  cystic duct had avulsed off of  the gallbladder.  At this point in the case, we had not used scissors to divide any structures.  No bile leakage was noted either from the gallbladder or from the cystic duct stump.  I grasped what I thought was the cystic duct stump and ligated with clips.  There were no other connections to the gallbladder.    The gallbladder was dissected from the liver bed in retrograde fashion with the electrocautery. The gallbladder was removed and placed in an Endocatch sac. The liver bed was irrigated and inspected. Hemostasis was achieved with the electrocautery and Surgicel Snow. Copious irrigation was utilized and was repeatedly aspirated until clear.  The gallbladder and Endocatch sac were then removed through the umbilical port site.  The pursestring suture was used to close the umbilical fascia.   A drain was placed in the gallbladder fossa through the most lateral drain site on the right and sutured in place with 2-0 Ethilon.  We again inspected the right upper quadrant for hemostasis.  Pneumoperitoneum was released as we removed the trocars.  4-0 Monocryl was used to close the skin.   Benzoin, steri-strips, and clean dressings were applied. The patient was then extubated and brought to the recovery room in stable condition. Instrument, sponge, and needle counts were correct at closure and at the conclusion of the case.   Findings: Cholecystitis with Cholelithiasis  Estimated Blood Loss: less than 100 mL         Drains: JP drain in gallbladder fossa         Specimens: Gallbladder           Complications: None; patient tolerated the procedure well.         Disposition: PACU - hemodynamically stable.         Condition: stable  Wilmon Arms. Corliss Skains, MD, [facility] Surgery  General/ Trauma Surgery  08/12/2014 1:11 PM

## 2014-08-13 DIAGNOSIS — R1011 Right upper quadrant pain: Secondary | ICD-10-CM | POA: Diagnosis present

## 2014-08-13 DIAGNOSIS — Z882 Allergy status to sulfonamides status: Secondary | ICD-10-CM | POA: Diagnosis not present

## 2014-08-13 DIAGNOSIS — Z886 Allergy status to analgesic agent status: Secondary | ICD-10-CM | POA: Diagnosis not present

## 2014-08-13 DIAGNOSIS — K8000 Calculus of gallbladder with acute cholecystitis without obstruction: Secondary | ICD-10-CM | POA: Diagnosis present

## 2014-08-13 DIAGNOSIS — Z791 Long term (current) use of non-steroidal anti-inflammatories (NSAID): Secondary | ICD-10-CM | POA: Diagnosis not present

## 2014-08-13 LAB — COMPREHENSIVE METABOLIC PANEL
ALK PHOS: 228 U/L — AB (ref 39–117)
ALT: 49 U/L — ABNORMAL HIGH (ref 0–35)
ANION GAP: 11 (ref 5–15)
AST: 48 U/L — ABNORMAL HIGH (ref 0–37)
Albumin: 2.5 g/dL — ABNORMAL LOW (ref 3.5–5.2)
BILIRUBIN TOTAL: 0.3 mg/dL (ref 0.3–1.2)
BUN: 4 mg/dL — AB (ref 6–23)
CHLORIDE: 105 meq/L (ref 96–112)
CO2: 27 meq/L (ref 19–32)
CREATININE: 0.72 mg/dL (ref 0.50–1.10)
Calcium: 9.5 mg/dL (ref 8.4–10.5)
GFR calc Af Amer: >90 mL/min (ref >90)
GFR calc non Af Amer: >90 mL/min (ref >90)
Glucose, Bld: 139 mg/dL — ABNORMAL HIGH (ref 70–99)
Potassium: 4.6 mEq/L (ref 3.7–5.3)
Sodium: 143 mEq/L (ref 137–147)
Total Protein: 6.5 g/dL (ref 6.0–8.3)

## 2014-08-13 LAB — CBC
HCT: 34.7 % — ABNORMAL LOW (ref 36.0–46.0)
Hemoglobin: 11.1 g/dL — ABNORMAL LOW (ref 12.0–15.0)
MCH: 28 pg (ref 26.0–34.0)
MCHC: 32 g/dL (ref 30.0–36.0)
MCV: 87.4 fL (ref 78.0–100.0)
PLATELETS: 282 10*3/uL (ref 150–400)
RBC: 3.97 MIL/uL (ref 3.87–5.11)
RDW: 12.5 % (ref 11.5–15.5)
WBC: 11.5 10*3/uL — AB (ref 4.0–10.5)

## 2014-08-13 MED ORDER — WHITE PETROLATUM GEL
Status: AC
  Administered 2014-08-13: 0.2
  Filled 2014-08-13: qty 5

## 2014-08-13 NOTE — ED Provider Notes (Signed)
Medical screening examination/treatment/procedure(s) were conducted as a shared visit with non-physician practitioner(s) and myself.  I personally evaluated the patient during the encounter.  Epigastric and right upper quadrant abdominal pain.  Concerning for cholelithiasis with developing cholecystitis.  IV Zosyn written for.  General surgery consultation.    Lyanne CoKevin M Alani Lacivita, MD 08/13/14 [phone]

## 2014-08-13 NOTE — Progress Notes (Signed)
1 Day Post-Op  Subjective: Some pain and rt shoulder pain/ no n/v. Tolerated liquids  Objective: Vital signs in last 24 hours: Temp:  [97.2 F (36.2 C)-98.9 F (37.2 C)] 98.3 F (36.8 C) (08/22 0502) Pulse Rate:  [60-76] 63 (08/22 0502) Resp:  [12-20] 17 (08/22 0502) BP: (103-124)/(45-60) 116/57 mmHg (08/22 0502) SpO2:  [96 %-99 %] 99 % (08/22 0502) Last BM Date: 08/12/14  Intake/Output from previous day: 08/21 0701 - 08/22 0700 In: 4505 [P.O.:760; I.V.:3745] Out: 1875 [Urine:1650; Drains:125; Blood:100] Intake/Output this shift:    Asleep, easily arousable cta b/l Reg Soft, approp TTP, drain - serosang No edema  Lab Results:   Recent Labs  08/12/14 0400 08/13/14 0535  WBC 8.9 11.5*  HGB 11.1* 11.1*  HCT 33.4* 34.7*  PLT 231 282   BMET  Recent Labs  08/12/14 0400 08/13/14 0535  NA 143 143  K 4.0 4.6  CL 106 105  CO2 26 27  GLUCOSE 139* 139*  BUN 6 4*  CREATININE 0.88 0.72  CALCIUM 8.8 9.5   PT/INR No results found for this basename: LABPROT, INR,  in the last 72 hours ABG No results found for this basename: PHART, PCO2, PO2, HCO3,  in the last 72 hours  Studies/Results: Dg Chest 2 View  08/12/2014   CLINICAL DATA:  Chest pain.  EXAM: CHEST  2 VIEW  COMPARISON:  None.  FINDINGS: The heart size and mediastinal contours are within normal limits. Both lungs are clear. The visualized skeletal structures are unremarkable.  IMPRESSION: Normal chest x-ray.   Electronically Signed   By: Loralie ChampagneMark  Gallerani M.D.   On: 08/12/2014 01:18   [place] Abdomen Complete  08/12/2014   CLINICAL DATA:  Abdominal pain.  EXAM: ULTRASOUND ABDOMEN COMPLETE  COMPARISON:  None.  FINDINGS: Gallbladder:  Multiple echogenic gallstones with acoustic shadowing measuring up to 15 mm. Gallbladder wall thickening to 4.1 mm. Sonographic Murphy's sign elicited. No pericholecystic fluid.  Common bile duct:  Diameter: 6 mm  Liver:  Diffusely mildly hypoechoic with apparent starry sky appearance. No  intrahepatic biliary dilatation.  IVC:  No abnormality visualized.  Pancreas:  Visualized portion unremarkable.  Spleen:  Size and appearance within normal limits.  Right Kidney:  Length: 11 cm.  Mildly echogenic without mass or hydronephrosis.  Left Kidney:  Length: 10.5 cm.  Mildly echogenic without mass or hydronephrosis.  Abdominal aorta:  No aneurysm visualized.  Other findings:  None.  IMPRESSION: Cholelithiasis and suspected acute cholecystitis.  Starry sky appearance of liver could reflect acute hepatitis, right heart failure, consider dedicated chest radiograph an correlation with liver function tests.   Electronically Signed   By: Awilda Metroourtnay  Bloomer   On: 08/12/2014 00:10    Anti-infectives: Anti-infectives   Start     Dose/Rate Route Frequency Ordered Stop   08/12/14 0230  ciprofloxacin (CIPRO) IVPB 400 mg     400 mg 200 mL/hr over 60 Minutes Intravenous 2 times daily 08/12/14 0209     08/12/14 0030  piperacillin-tazobactam (ZOSYN) IVPB 3.375 g     3.375 g 12.5 mL/hr over 240 Minutes Intravenous  Once 08/12/14 0023 08/12/14 0141      Assessment/Plan: s/p Procedure(s): LAPAROSCOPIC CHOLECYSTECTOMY (N/A) Doing well LFTs trending down No sign of bile leak Ambulate, IS Keep to monitor for bile leak  Mary SellaEric M. Andrey CampanileWilson, MD, FACS General, Bariatric, & Minimally Invasive Surgery [facility], [place]   LOS: 2 days    Atilano InaWILSON,Garfield Coiner M 08/13/2014

## 2014-08-14 LAB — COMPREHENSIVE METABOLIC PANEL
ALT: 42 U/L — AB (ref 0–35)
ANION GAP: 9 (ref 5–15)
AST: 35 U/L (ref 0–37)
Albumin: 2.3 g/dL — ABNORMAL LOW (ref 3.5–5.2)
Alkaline Phosphatase: 208 U/L — ABNORMAL HIGH (ref 39–117)
BUN: 5 mg/dL — AB (ref 6–23)
CALCIUM: 9 mg/dL (ref 8.4–10.5)
CO2: 29 mEq/L (ref 19–32)
CREATININE: 0.94 mg/dL (ref 0.50–1.10)
Chloride: 107 mEq/L (ref 96–112)
GFR, EST AFRICAN AMERICAN: 78 mL/min — AB (ref >90)
GFR, EST NON AFRICAN AMERICAN: 68 mL/min — AB (ref >90)
Glucose, Bld: 112 mg/dL — ABNORMAL HIGH (ref 70–99)
Potassium: 4.4 mEq/L (ref 3.7–5.3)
SODIUM: 145 meq/L (ref 137–147)
TOTAL PROTEIN: 5.9 g/dL — AB (ref 6.0–8.3)
Total Bilirubin: <0.2 mg/dL — ABNORMAL LOW (ref 0.3–1.2)

## 2014-08-14 MED ORDER — CIPROFLOXACIN HCL 500 MG PO TABS
500.0000 mg | ORAL_TABLET | Freq: Two times a day (BID) | ORAL | Status: DC
  Administered 2014-08-14 – 2014-08-16 (×5): 500 mg via ORAL
  Filled 2014-08-14 (×8): qty 1

## 2014-08-14 MED ORDER — PANTOPRAZOLE SODIUM 40 MG PO TBEC
40.0000 mg | DELAYED_RELEASE_TABLET | Freq: Every day | ORAL | Status: DC
  Administered 2014-08-14 – 2014-08-15 (×2): 40 mg via ORAL
  Filled 2014-08-14 (×2): qty 1
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
## 2014-08-14 NOTE — Progress Notes (Signed)
2 Days Post-Op  Subjective: Still with moderate post op pain Drain output still moderate  Objective: Vital signs in last 24 hours: Temp:  [98.1 F (36.7 C)-98.5 F (36.9 C)] 98.5 F (36.9 C) (08/23 0523) Pulse Rate:  [67-73] 69 (08/23 0523) Resp:  [16-17] 17 (08/23 0523) BP: (112-141)/(61-74) 141/74 mmHg (08/23 0523) SpO2:  [94 %-98 %] 94 % (08/23 0523) Last BM Date: 08/12/14  Intake/Output from previous day: 08/22 0701 - 08/23 0700 In: 2261.7 [P.O.:480; I.V.:1381.7; IV Piggyback:400] Out: 2070 [Urine:1950; Drains:120] Intake/Output this shift:    Abdomen soft, drain serosang without bile  Lab Results:   Recent Labs  08/12/14 0400 08/13/14 0535  WBC 8.9 11.5*  HGB 11.1* 11.1*  HCT 33.4* 34.7*  PLT 231 282   BMET  Recent Labs  08/13/14 0535 08/14/14 0506  NA 143 145  K 4.6 4.4  CL 105 107  CO2 27 29  GLUCOSE 139* 112*  BUN 4* 5*  CREATININE 0.72 0.94  CALCIUM 9.5 9.0   PT/INR No results found for this basename: LABPROT, INR,  in the last 72 hours ABG No results found for this basename: PHART, PCO2, PO2, HCO3,  in the last 72 hours  Studies/Results: No results found.  Anti-infectives: Anti-infectives   Start     Dose/Rate Route Frequency Ordered Stop   08/14/14 0900  ciprofloxacin (CIPRO) tablet 500 mg     500 mg Oral 2 times daily 08/14/14 0808     08/12/14 0230  ciprofloxacin (CIPRO) IVPB 400 mg  Status:  Discontinued     400 mg 200 mL/hr over 60 Minutes Intravenous 2 times daily 08/12/14 0209 08/14/14 0808   08/12/14 0030  piperacillin-tazobactam (ZOSYN) IVPB 3.375 g     3.375 g 12.5 mL/hr over 240 Minutes Intravenous  Once 08/12/14 0023 08/12/14 0141      Assessment/Plan: s/p Procedure(s): LAPAROSCOPIC CHOLECYSTECTOMY (N/Smith)  Continue antibiotics and drain Repeat CBC, CMP tomorrow Currently no evidence of bile leak  LOS: 3 days    Meghan Smith 08/14/2014

## 2014-08-15 ENCOUNTER — Encounter (HOSPITAL_COMMUNITY): Payer: Self-pay | Admitting: Surgery

## 2014-08-15 LAB — COMPREHENSIVE METABOLIC PANEL WITH GFR
ALT: 45 U/L — ABNORMAL HIGH (ref 0–35)
AST: 34 U/L (ref 0–37)
Albumin: 2.4 g/dL — ABNORMAL LOW (ref 3.5–5.2)
Alkaline Phosphatase: 244 U/L — ABNORMAL HIGH (ref 39–117)
Anion gap: 8 (ref 5–15)
BUN: 4 mg/dL — ABNORMAL LOW (ref 6–23)
CO2: 31 meq/L (ref 19–32)
Calcium: 9.1 mg/dL (ref 8.4–10.5)
Chloride: 105 meq/L (ref 96–112)
Creatinine, Ser: 0.86 mg/dL (ref 0.50–1.10)
GFR calc Af Amer: 87 mL/min — ABNORMAL LOW
GFR calc non Af Amer: 75 mL/min — ABNORMAL LOW
Glucose, Bld: 108 mg/dL — ABNORMAL HIGH (ref 70–99)
Potassium: 4.2 meq/L (ref 3.7–5.3)
Sodium: 144 meq/L (ref 137–147)
Total Bilirubin: 0.3 mg/dL (ref 0.3–1.2)
Total Protein: 6 g/dL (ref 6.0–8.3)

## 2014-08-15 LAB — CBC
HCT: 34.3 % — ABNORMAL LOW (ref 36.0–46.0)
Hemoglobin: 11 g/dL — ABNORMAL LOW (ref 12.0–15.0)
MCH: 28.4 pg (ref 26.0–34.0)
MCHC: 32.1 g/dL (ref 30.0–36.0)
MCV: 88.6 fL (ref 78.0–100.0)
Platelets: 300 10*3/uL (ref 150–400)
RBC: 3.87 MIL/uL (ref 3.87–5.11)
RDW: 12.5 % (ref 11.5–15.5)
WBC: 6.7 10*3/uL (ref 4.0–10.5)

## 2014-08-15 MED ORDER — OXYCODONE-ACETAMINOPHEN 5-325 MG PO TABS
1.0000 | ORAL_TABLET | ORAL | Status: DC | PRN
  Administered 2014-08-15 (×3): 2 via ORAL
  Filled 2014-08-15 (×5): qty 2

## 2014-08-15 MED ORDER — MORPHINE SULFATE 2 MG/ML IJ SOLN
2.0000 mg | INTRAMUSCULAR | Status: DC | PRN
  Administered 2014-08-15 – 2014-08-16 (×5): 2 mg via INTRAVENOUS
  Filled 2014-08-15 (×5): qty 1

## 2014-08-15 MED ORDER — SODIUM CHLORIDE 0.9 % IJ SOLN: 3.0000 mL | INTRAMUSCULAR | Status: DC | PRN

## 2014-08-15 MED ORDER — SODIUM CHLORIDE 0.9 % IJ SOLN
3.0000 mL | Freq: Two times a day (BID) | INTRAMUSCULAR | Status: DC
  Administered 2014-08-15 – 2014-08-16 (×2): 3 mL via INTRAVENOUS
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
## 2014-08-15 NOTE — Progress Notes (Signed)
Working on safe dispo. Patient examined and I agree with the assessment and plan  Violeta Gelinas, MD, MPH, FACS Trauma: [phone] General Surgery: [phone]  08/15/2014 5:57 PM

## 2014-08-16 MED ORDER — HYDROCODONE-ACETAMINOPHEN 7.5-325 MG PO TABS: 1.0000 | ORAL_TABLET | Freq: Four times a day (QID) | ORAL | Status: DC | PRN

## 2014-08-16 MED ORDER — HYDROCODONE-ACETAMINOPHEN 5-325 MG PO TABS
2.0000 | ORAL_TABLET | Freq: Once | ORAL | Status: AC
  Administered 2014-08-16: 2 via ORAL
  Filled 2014-08-16: qty 2

## 2014-08-16 NOTE — Discharge Summary (Signed)
Patient examined and I agree with the assessment and plan  Violeta Gelinas, MD, MPH, FACS Trauma: [phone] General Surgery: [phone]  08/16/2014 9:52 AM

## 2014-08-16 NOTE — Discharge Summary (Signed)
Patient ID: Meghan Smith MRN: [id number] DOB/AGE: 1961/06/29 53 y.o.  Admit date: 08/11/2014 Discharge date: 08/16/2014  Procedures: lap chole   Consults: None  Reason for Admission: Patient has been having episodic right upper quadrant abdominal pain associated with eating for the past 2 months. This became much worse 48 hours ago. She came to the emergency department for evaluation. She was found to have mild leukocytosis of 13,000. Ultrasound was done showing gallstones and suspected acute cholecystitis. Her pain is improved partially after receiving pain medication. She remains nauseated. I was asked to see her for admission.  Admission Diagnoses:  1. Cholecystitis with cholelithiasis  Hospital Course: The patient was admitted and taken to the OR the following day.  She had a difficult gallbladder and a drain was placed due to an avulsion of the cystic duct.  She was kept for several days post op to rule out a bile leak.  Her drain remained normal.  Her diet was advanced as tolerated. Of note, I did speak to her neurologist, Dr. Antonietta Barcelona, who was agreeable to acute pain control with narcotics per [place].  On POD 4, the patient was stable for dc home.  PE: Abd: soft, minimally tender, incisions c/d/i, drain with serosang output  Discharge Diagnoses:  Active Problems:   Cholecystitis with cholelithiasis s/p lap chole  Discharge Medications:   Medication List         clonazePAM 0.5 MG tablet  Commonly known as:  KLONOPIN  Take 0.5 mg by mouth at bedtime.     HYDROcodone-acetaminophen 7.5-325 MG per tablet  Commonly known as:  NORCO  Take 1-2 tablets by mouth every 6 (six) hours as needed for moderate pain.        Discharge Instructions:     Follow-up Information   Follow up with Wynona Luna., MD In 1 week. (our office will call you)    Specialty:  General Surgery   Contact information:   [address] [place] [address] [phone]        Signed: Letha Cape 08/16/2014, 9:42 AM

## 2014-08-16 NOTE — Discharge Instructions (Signed)
Bulb Drain Home Care °A bulb drain consists of a thin rubber tube and a soft, round bulb that creates a gentle suction. The rubber tube is placed in the area where you had surgery. A bulb is attached to the end of the tube that is outside the body. The bulb drain removes excess fluid that normally builds up in a surgical wound after surgery. The color and amount of fluid will vary. Immediately after surgery, the fluid is bright red and is a little thicker than water. It may gradually change to a yellow or pink color and become more thin and water-like. When the amount decreases to about 1 or 2 tbsp in 24 hours, your health care provider will usually remove it. °DAILY CARE °· Keep the bulb flat (compressed) at all times, except while emptying it. The flatness creates suction. You can flatten the bulb by squeezing it firmly in the middle and then closing the cap. °· Keep sites where the tube enters the skin dry and covered with a bandage (dressing). °· Secure the tube 1-2 in (2.5-5.1 cm) below the insertion sites to keep it from pulling on your stitches. The tube is stitched in place and will not slip out. °· Secure the bulb as directed by your health care provider. °· For the first 3 days after surgery, there usually is more fluid in the bulb. Empty the bulb whenever it becomes half full because the bulb does not create enough suction if it is too full. The bulb could also overflow. Write down how much fluid you remove each time you empty your drain. Add up the amount removed in 24 hours. °· Empty the bulb at the same time every day once the amount of fluid decreases and you only need to empty it once a day. Write down the amounts and the 24-hour totals to give to your health care provider. This helps your health care provider know when the tubes can be removed. °EMPTYING THE BULB DRAIN °Before emptying the bulb, get a measuring cup, a piece of paper and a pen, and wash your hands. °· Gently run your fingers down the  tube (stripping) to empty any drainage from the tubing into the bulb. This may need to be done several times a day to clear the tubing of clots and tissue. °· Open the bulb cap to release suction, which causes it to inflate. Do not touch the inside of the cap. °· Gently run your fingers down the tube (stripping) to empty any drainage from the tubing into the bulb. °· Hold the cap out of the way, and pour fluid into the measuring cup.   °· Squeeze the bulb to provide suction.  °· Replace the cap.   °· Check the tape that holds the tube to your skin. If it is becoming loose, you can remove the loose piece of tape and apply a new one. Then, pin the bulb to your shirt.   °· Write down the amount of fluid you emptied out. Write down the date and each time you emptied your bulb drain. (If there are 2 bulbs, note the amount of drainage from each bulb and keep the totals separate. Your health care provider will want to know the total amounts for each drain and which tube is draining more.)   °· Flush the fluid down the toilet and wash your hands.   °· Call your health care provider once you have less than 2 tbsp of fluid collecting in the bulb drain every 24 hours. °If   there is drainage around the tube site, change dressings and keep the area dry. Cleanse around tube with sterile saline and place dry gauze around site. This gauze should be changed when it is soiled. If it stays clean and unsoiled, it should still be changed daily.  °SEEK MEDICAL CARE IF: °· Your drainage has a bad smell or is cloudy.   °· You have a fever.   °· Your drainage is increasing instead of decreasing.   °· Your tube fell out.   °· You have redness or swelling around the tube site.   °· You have drainage from a surgical wound.   °· Your bulb drain will not stay flat after you empty it.   °MAKE SURE YOU:  °· Understand these instructions. °· Will watch your condition. °· Will get help right away if you are not doing well or get worse. °Document  Released: 12/06/2000 Document Revised: 04/25/2014 Document Reviewed: 05/13/2012 °[employer]® Patient Information ©2015 [employer]. This information is not intended to replace advice given to you by your health care provider. Make sure you discuss any questions you have with your health care provider. ° °[facility] ______[facility] °LAPAROSCOPIC SURGERY: POST OP INSTRUCTIONS °Always review your discharge instruction sheet given to you by the facility where your surgery was performed. °IF YOU HAVE DISABILITY OR FAMILY LEAVE FORMS, YOU MUST BRING THEM TO THE OFFICE FOR PROCESSING.   °DO NOT GIVE THEM TO YOUR DOCTOR. ° °1. A prescription for pain medication may be given to you upon discharge.  Take your pain medication as prescribed, if needed.  If narcotic pain medicine is not needed, then you may take acetaminophen (Tylenol) or ibuprofen (Advil) as needed. °2. Take your usually prescribed medications unless otherwise directed. °3. If you need a refill on your pain medication, please contact your pharmacy.  They will contact our office to request authorization. Prescriptions will not be filled after 5pm or on week-ends. °4. You should follow a light diet the first few days after arrival home, such as soup and crackers, etc.  Be sure to include lots of fluids daily. °5. Most patients will experience some swelling and bruising in the area of the incisions.  Ice packs will help.  Swelling and bruising can take several days to resolve.  °6. It is common to experience some constipation if taking pain medication after surgery.  Increasing fluid intake and taking a stool softener (such as Colace) will usually help or prevent this problem from occurring.  A mild laxative (Milk of Magnesia or Miralax) should be taken according to package instructions if there are no bowel movements after 48 hours. °7. Unless discharge instructions indicate otherwise, you may remove your bandages 24-48 hours after surgery, and you  may shower at that time.  You may have steri-strips (small skin tapes) in place directly over the incision.  These strips should be left on the skin for 7-10 days.  If your surgeon used skin glue on the incision, you may shower in 24 hours.  The glue will flake off over the next 2-3 weeks.  Any sutures or staples will be removed at the office during your follow-up visit. °8. ACTIVITIES:  You may resume regular (light) daily activities beginning the next day--such as daily self-care, walking, climbing stairs--gradually increasing activities as tolerated.  You may have sexual intercourse when it is comfortable.  Refrain from any heavy lifting or straining until approved by your doctor. °a. You may drive when you are no longer taking prescription pain medication, you   can comfortably wear a seatbelt, and you can safely maneuver your car and apply brakes. °b. RETURN TO WORK:  __________________________________________________________ °9. You should see your doctor in the office for a follow-up appointment approximately 2-3 weeks after your surgery.  Make sure that you call for this appointment within a day or two after you arrive home to insure a convenient appointment time. °10. OTHER INSTRUCTIONS: __________________________________________________________________________________________________________________________ __________________________________________________________________________________________________________________________ °WHEN TO CALL YOUR DOCTOR: °1. Fever over 101.0 °2. Inability to urinate °3. Continued bleeding from incision. °4. Increased pain, redness, or drainage from the incision. °5. Increasing abdominal pain ° °The clinic staff is available to answer your questions during regular business hours.  Please don’t hesitate to call and ask to speak to one of the nurses for clinical concerns.  If you have a medical emergency, go to the nearest emergency room or call 911.  A surgeon from [facility] is always on call at the hospital. °[address], [address], [place], [place]  [address] ? [address] [address], [place],    [address] °[phone] ? [phone] ? FAX [phone] °Web site: [email or web address] ° °

## 2014-08-22 ENCOUNTER — Encounter (INDEPENDENT_AMBULATORY_CARE_PROVIDER_SITE_OTHER): Payer: Self-pay | Admitting: Surgery

## 2014-08-22 ENCOUNTER — Ambulatory Visit (INDEPENDENT_AMBULATORY_CARE_PROVIDER_SITE_OTHER): Payer: 59 | Admitting: Surgery

## 2014-08-22 VITALS — BP 126/80 | HR 75 | Temp 97.0°F | Ht 67.0 in | Wt 165.0 lb

## 2014-08-22 DIAGNOSIS — K8010 Calculus of gallbladder with chronic cholecystitis without obstruction: Secondary | ICD-10-CM

## 2014-08-22 NOTE — Progress Notes (Signed)
Status post laparoscopic cholecystectomy for gangrenous cholecystitis Performed on 08/12/14. The patient was discharged home with a drain in place. The drain is putting out minimal amounts of serosanguineous fluid. No sign of bile leak. The patient has limited appetite but she says that this is mostly because she cannot afford to have food at home. She has a very difficult social situation. We will attempt to find some resources to help her with this problem. Her incisions are all well-healed with no sign of infection. The drain is removed without difficulty. She is having daily bowel movements. She may begin resuming full acuity. We will see her back as needed.  Wilmon Arms. Corliss Skains, MD, [facility]  General/ Trauma Surgery  08/22/2014 12:34 PM

## 2014-12-25 ENCOUNTER — Emergency Department (INDEPENDENT_AMBULATORY_CARE_PROVIDER_SITE_OTHER)
Admission: EM | Admit: 2014-12-25 | Discharge: 2014-12-25 | Disposition: A | Discharge status: Home / Self Care | Payer: Self-pay | Source: Home / Self Care | Attending: Family Medicine | Admitting: Family Medicine

## 2014-12-25 ENCOUNTER — Encounter (HOSPITAL_COMMUNITY): Payer: Self-pay

## 2014-12-25 DIAGNOSIS — N390 Urinary tract infection, site not specified: Secondary | ICD-10-CM

## 2014-12-25 LAB — POCT URINALYSIS DIP (DEVICE)
BILIRUBIN URINE: NEGATIVE
Glucose, UA: NEGATIVE mg/dL
KETONES UR: NEGATIVE mg/dL
Nitrite: POSITIVE — AB
PH: 6 (ref 5.0–8.0)
Protein, ur: >=300 mg/dL — AB
Specific Gravity, Urine: >=1.03 (ref 1.005–1.030)
Urobilinogen, UA: 0.2 mg/dL (ref 0.0–1.0)

## 2014-12-25 MED ORDER — CEPHALEXIN 500 MG PO CAPS: 500.0000 mg | ORAL_CAPSULE | Freq: Four times a day (QID) | ORAL | Status: AC

## 2014-12-25 NOTE — Discharge Instructions (Signed)
Urinary Tract Infection AZO Standard or least expensive generic. 1-2 tabs every 8 hours for urinary discomfort. Tylenol as needed Urinary tract infections (UTIs) can develop anywhere along your urinary tract. Your urinary tract is your body's drainage system for removing wastes and extra water. Your urinary tract includes two kidneys, two ureters, a bladder, and a urethra. Your kidneys are a pair of bean-shaped organs. Each kidney is about the size of your fist. They are located below your ribs, one on each side of your spine. CAUSES Infections are caused by microbes, which are microscopic organisms, including fungi, viruses, and bacteria. These organisms are so small that they can only be seen through a microscope. Bacteria are the microbes that most commonly cause UTIs. SYMPTOMS  Symptoms of UTIs may vary by age and gender of the patient and by the location of the infection. Symptoms in young women typically include a frequent and intense urge to urinate and a painful, burning feeling in the bladder or urethra during urination. Older women and men are more likely to be tired, shaky, and weak and have muscle aches and abdominal pain. A fever may mean the infection is in your kidneys. Other symptoms of a kidney infection include pain in your back or sides below the ribs, nausea, and vomiting. DIAGNOSIS To diagnose a UTI, your caregiver will ask you about your symptoms. Your caregiver also will ask to provide a urine sample. The urine sample will be tested for bacteria and white blood cells. White blood cells are made by your body to help fight infection. TREATMENT  Typically, UTIs can be treated with medication. Because most UTIs are caused by a bacterial infection, they usually can be treated with the use of antibiotics. The choice of antibiotic and length of treatment depend on your symptoms and the type of bacteria causing your infection. HOME CARE INSTRUCTIONS  If you were prescribed antibiotics,  take them exactly as your caregiver instructs you. Finish the medication even if you feel better after you have only taken some of the medication.  Drink enough water and fluids to keep your urine clear or pale yellow.  Avoid caffeine, tea, and carbonated beverages. They tend to irritate your bladder.  Empty your bladder often. Avoid holding urine for long periods of time.  Empty your bladder before and after sexual intercourse.  After a bowel movement, women should cleanse from front to back. Use each tissue only once. SEEK MEDICAL CARE IF:   You have back pain.  You develop a fever.  Your symptoms do not begin to resolve within 3 days. SEEK IMMEDIATE MEDICAL CARE IF:   You have severe back pain or lower abdominal pain.  You develop chills.  You have nausea or vomiting.  You have continued burning or discomfort with urination. MAKE SURE YOU:   Understand these instructions.  Will watch your condition.  Will get help right away if you are not doing well or get worse. Document Released: 09/18/2005 Document Revised: 06/09/2012 Document Reviewed: 01/17/2012 [facility] Patient Information 2015 [place], [place]. This information is not intended to replace advice given to you by your health care provider. Make sure you discuss any questions you have with your health care provider.

## 2014-12-25 NOTE — ED Notes (Signed)
C/o UTI . Pain , urgency, burning, lower abdominal full ness . NAD at present

## 2014-12-25 NOTE — ED Provider Notes (Addendum)
CSN: [id number]     Arrival date & time 12/25/14  1204 History   First MD Initiated Contact with Patient 12/25/14 1215     Chief Complaint  Patient presents with  . Urinary Tract Infection   (Consider location/radiation/quality/duration/timing/severity/associated sxs/prior Treatment) HPI Comments: 54 year old female complaining of urinary frequency and urgency. States not so much with dysuria. She has had a decreased oral intake of fluids over the past 2-3 days because she was on a fishing trip and unable to void in public. Denies GI symptoms, flank pain or fever.   Past Medical History  Diagnosis Date  . SI (sacroiliac) joint inflammation    Past Surgical History  Procedure Laterality Date  . Cholecystectomy N/A 08/12/2014    Procedure: LAPAROSCOPIC CHOLECYSTECTOMY;  Surgeon: Wilmon Arms. Corliss Skains, MD;  Location: [facility] OR;  Service: General;  Laterality: N/A;   History reviewed. No pertinent family history. History  Substance Use Topics  . Smoking status: Never Smoker   . Smokeless tobacco: Not on file  . Alcohol Use: No   OB History    No data available     Review of Systems  Constitutional: Positive for activity change. Negative for fever.  HENT: Negative.   Respiratory: Negative for cough and shortness of breath.   Cardiovascular: Negative for chest pain.  Gastrointestinal: Negative.   Genitourinary: Positive for urgency and frequency. Negative for dysuria, hematuria, flank pain, vaginal bleeding, vaginal discharge and pelvic pain.  Neurological: Negative.     Allergies  Sulfa antibiotics; Adhesive; and Toradol  Home Medications   Prior to Admission medications   Medication Sig Start Date End Date Taking? Authorizing Provider  cephALEXin (KEFLEX) 500 MG capsule Take 1 capsule (500 mg total) by mouth 4 (four) times daily. 12/25/14   Hayden Rasmussen, NP  clonazePAM (KLONOPIN) 0.5 MG tablet Take 0.5 mg by mouth at bedtime.    Historical Provider, MD   BP 148/89 mmHg  Pulse 60   Temp(Src) 98.2 F (36.8 C) (Oral)  Resp 16  SpO2 100% Physical Exam  Constitutional: She is oriented to person, place, and time. She appears well-developed and well-nourished. No distress.  Neck: Normal range of motion. Neck supple.  Cardiovascular: Normal rate and normal heart sounds.   Pulmonary/Chest: Effort normal and breath sounds normal.  Abdominal: Soft. She exhibits no mass. There is no rebound and no guarding.  Suprapubic tenderness  Neurological: She is alert and oriented to person, place, and time.  Skin: Skin is warm and dry.  Psychiatric: She has a normal mood and affect.  Nursing note and vitals reviewed.   ED Course  Procedures (including critical care time) Labs Review Labs Reviewed  POCT URINALYSIS DIP (DEVICE) - Abnormal; Notable for the following:    Hgb urine dipstick LARGE (*)    Protein, ur >=300 (*)    Nitrite POSITIVE (*)    Leukocytes, UA MODERATE (*)    All other components within normal limits  URINE CULTURE    Imaging Review No results found.   MDM   1. UTI (lower urinary tract infection)    AZO Standard or least expensive generic. 1-2 tabs every 8 hours for urinary discomfort. Tylenol as needed Urine culture pending Treated with Keflex qid      Hayden Rasmussen, NP 12/25/14 1304  Hayden Rasmussen, NP 12/27/14 [phone]

## 2014-12-28 LAB — URINE CULTURE
Colony Count: >=100000
SPECIAL REQUESTS: NORMAL

## 2014-12-28 NOTE — ED Notes (Signed)
Urine culture: >100,000 colonies E. Coli.  Pt. adequately treated with Keflex. Meghan Smith M 12/28/2014  

## 2015-01-25 IMAGING — CR DG CHEST 2V
2 series · 2 of 2 positions shown · non-contrast
Comparison: None.

CLINICAL DATA: Chest pain.

EXAM:
CHEST  2 VIEW

[w chest pa]
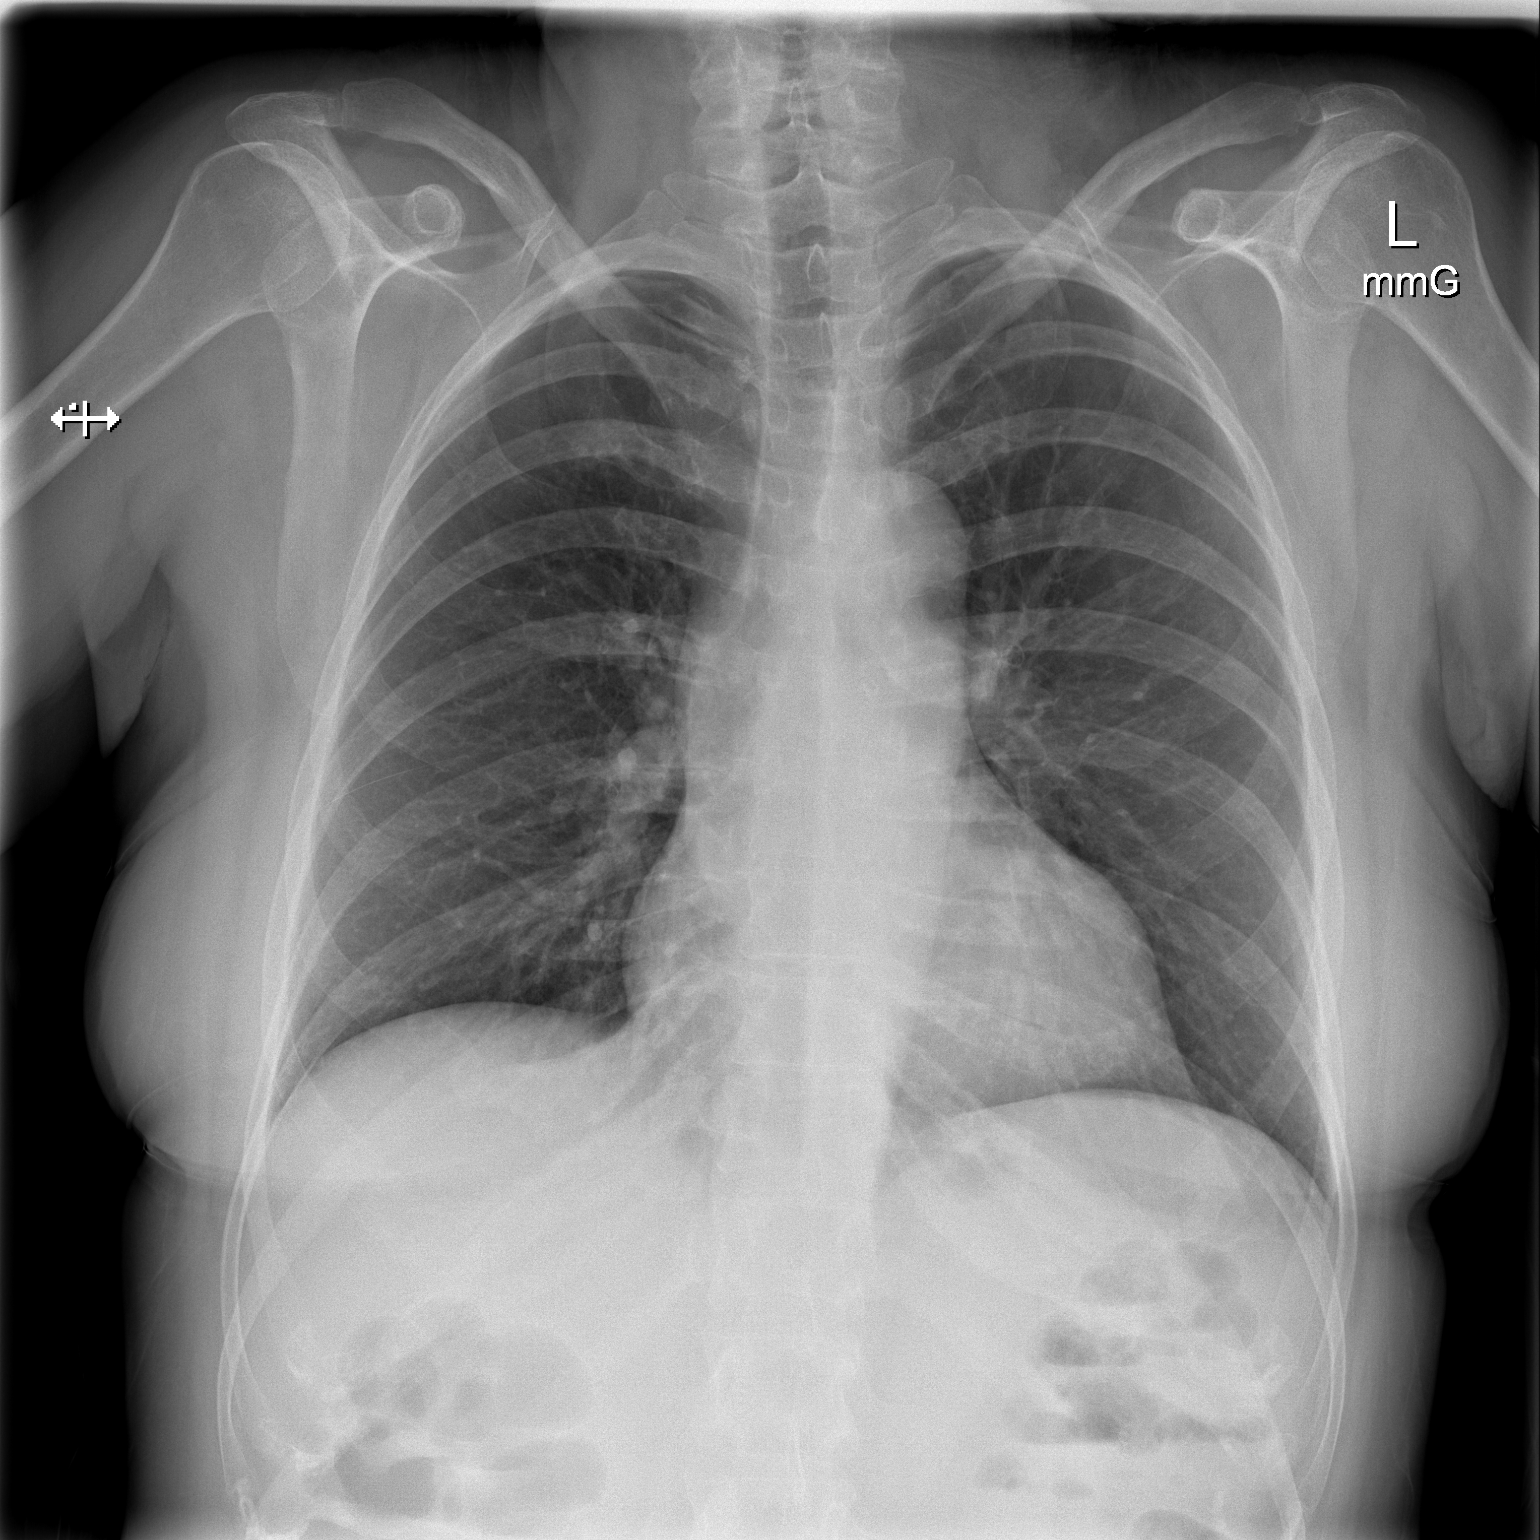

[w chest lat]
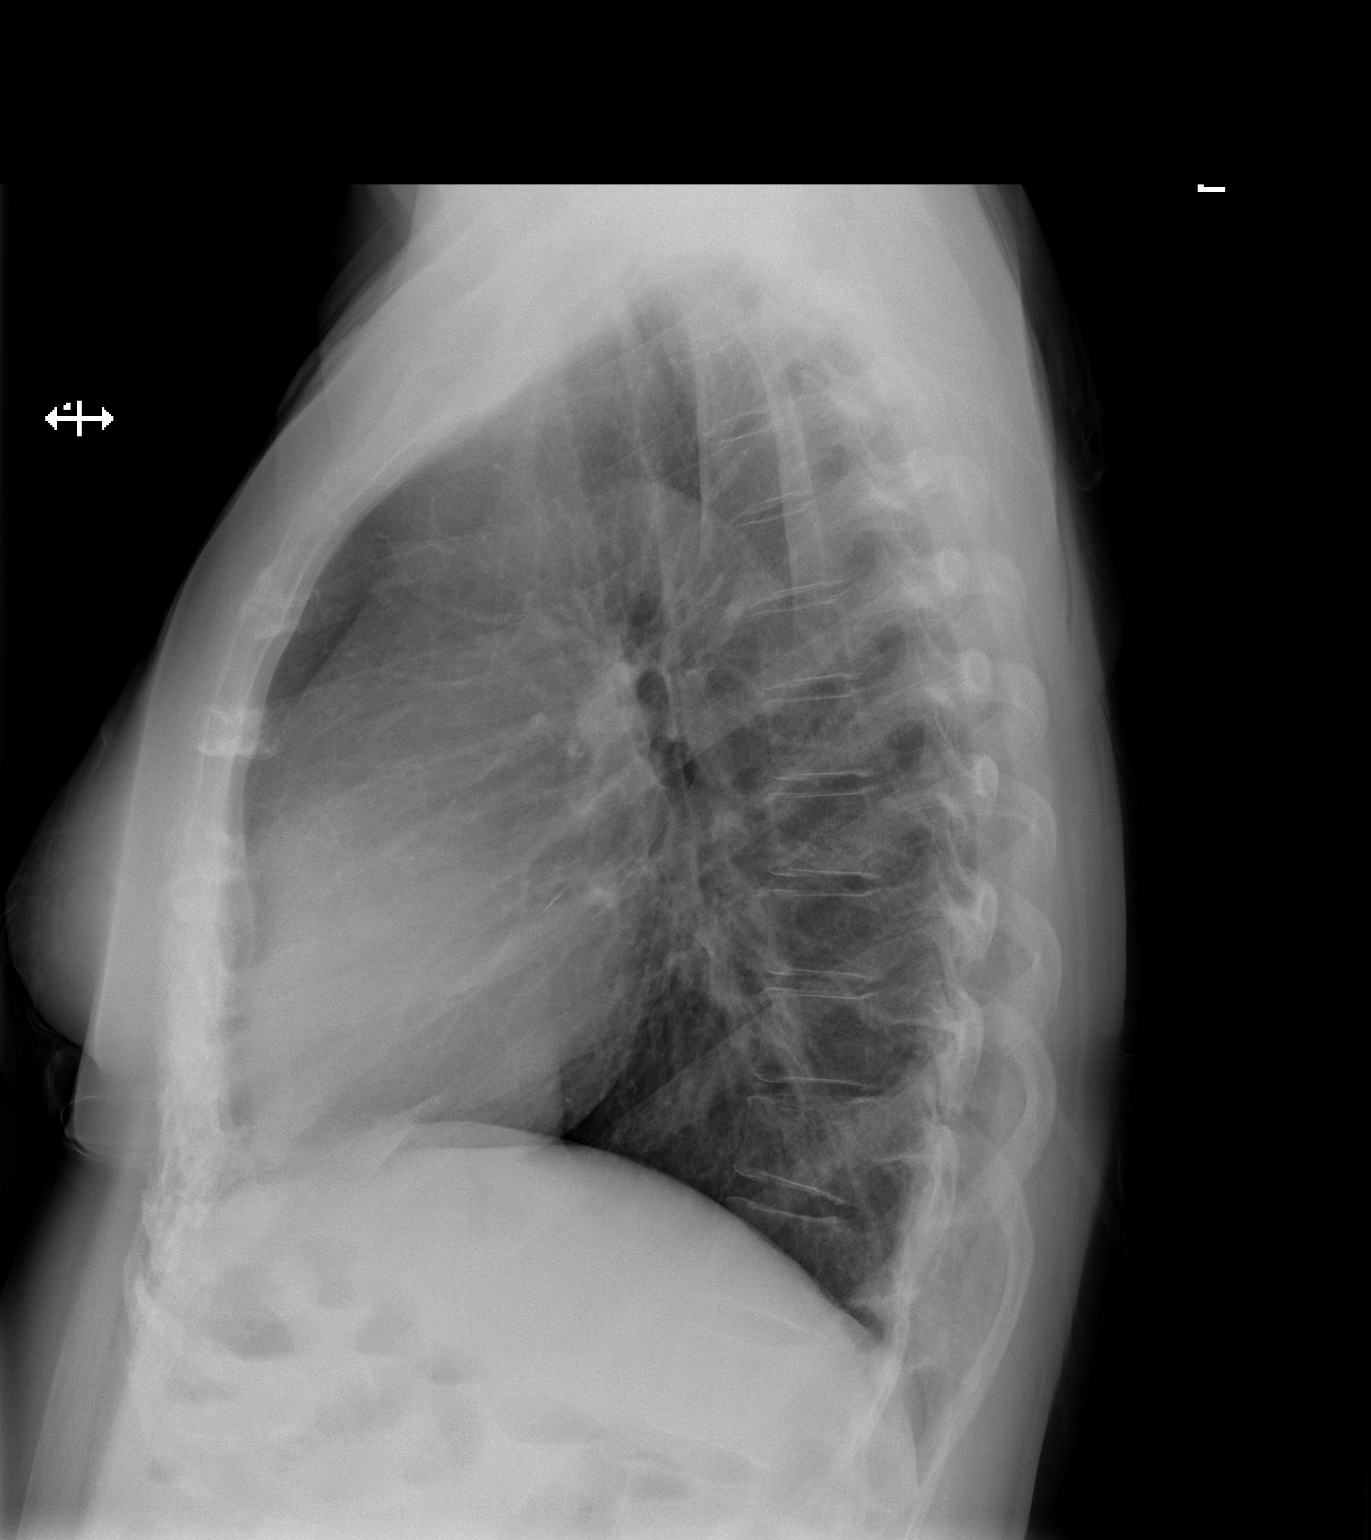

[2 of 2 positions shown; findings below may reference images not displayed]

FINDINGS: The heart size and mediastinal contours are within normal limits.
Both lungs are clear. The visualized skeletal structures are
unremarkable.
IMPRESSION: Normal chest x-ray.
